# Patient Record
Sex: Male | Born: 1990
Health system: Southern US, Community
[De-identification: ages and names within clinical notes are randomized; demographics above are authoritative.]

## PROBLEM LIST (undated history)

## (undated) DIAGNOSIS — S91339A Puncture wound without foreign body, unspecified foot, initial encounter: Secondary | ICD-10-CM

## (undated) DIAGNOSIS — W3400XA Accidental discharge from unspecified firearms or gun, initial encounter: Secondary | ICD-10-CM

## (undated) HISTORY — PX: WISDOM TOOTH EXTRACTION: SHX21

## (undated) HISTORY — PX: NO PAST SURGERIES: SHX2092

---
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
## 2012-05-09 ENCOUNTER — Encounter (HOSPITAL_BASED_OUTPATIENT_CLINIC_OR_DEPARTMENT_OTHER): Payer: Self-pay | Admitting: *Deleted

## 2012-05-09 ENCOUNTER — Emergency Department (HOSPITAL_BASED_OUTPATIENT_CLINIC_OR_DEPARTMENT_OTHER)
Admission: EM | Admit: 2012-05-09 | Discharge: 2012-05-09 | Disposition: A | Discharge status: Home / Self Care | Payer: Managed Care, Other (non HMO) | Attending: Emergency Medicine | Admitting: Emergency Medicine

## 2012-05-09 DIAGNOSIS — Z23 Encounter for immunization: Secondary | ICD-10-CM | POA: Insufficient documentation

## 2012-05-09 DIAGNOSIS — F17200 Nicotine dependence, unspecified, uncomplicated: Secondary | ICD-10-CM | POA: Insufficient documentation

## 2012-05-09 DIAGNOSIS — IMO0002 Reserved for concepts with insufficient information to code with codable children: Secondary | ICD-10-CM

## 2012-05-09 DIAGNOSIS — S21209A Unspecified open wound of unspecified back wall of thorax without penetration into thoracic cavity, initial encounter: Secondary | ICD-10-CM | POA: Insufficient documentation

## 2012-05-09 MED ORDER — LIDOCAINE HCL 2 % IJ SOLN
INTRAMUSCULAR | Status: AC
  Administered 2012-05-09: 400 mg
  Filled 2012-05-09: qty 1

## 2012-05-09 MED ORDER — "THROMBI-PAD 3""X3"" EX PADS"
MEDICATED_PAD | CUTANEOUS | Status: AC
  Administered 2012-05-09: 1 via TOPICAL
  Filled 2012-05-09: qty 1

## 2012-05-09 MED ORDER — LIDOCAINE-EPINEPHRINE 2 %-1:100000 IJ SOLN
20.0000 mL | Freq: Once | INTRAMUSCULAR | Status: AC
  Administered 2012-05-09: 20 mL

## 2012-05-09 MED ORDER — TETANUS-DIPHTH-ACELL PERTUSSIS 5-2.5-18.5 LF-MCG/0.5 IM SUSP
0.5000 mL | Freq: Once | INTRAMUSCULAR | Status: AC
  Administered 2012-05-09: 0.5 mL via INTRAMUSCULAR
  Filled 2012-05-09: qty 0.5

## 2012-05-09 MED ORDER — "THROMBI-PAD 3""X3"" EX PADS"
1.0000 | MEDICATED_PAD | Freq: Once | CUTANEOUS | Status: AC
  Administered 2012-05-09: 1 via TOPICAL

## 2012-05-09 MED ORDER — LIDOCAINE HCL 2 % IJ SOLN
20.0000 mL | Freq: Once | INTRAMUSCULAR | Status: AC
  Administered 2012-05-09: 400 mg

## 2012-05-09 MED ORDER — LIDOCAINE-EPINEPHRINE 2 %-1:100000 IJ SOLN
INTRAMUSCULAR | Status: AC
  Administered 2012-05-09: 20 mL
  Filled 2012-05-09: qty 1

## 2012-05-09 NOTE — Discharge Instructions (Signed)
Laceration Care, Adult °A laceration is a cut that goes through all layers of the skin. The cut goes into the tissue beneath the skin. °HOME CARE °For stitches (sutures) or staples: °· Keep the cut clean and dry.  °· If you have a bandage (dressing), change it at least once a day. Change the bandage if it gets wet or dirty, or as told by your doctor.  °· Wash the cut with soap and water 2 times a day. Rinse the cut with water. Pat it dry with a clean towel.  °· Put a thin layer of medicated cream on the cut as told by your doctor.  °· You may shower after the first 24 hours. Do not soak the cut in water until the stitches are removed.  °· Only take medicines as told by your doctor.  °· Have your stitches or staples removed as told by your doctor.  °For skin adhesive strips: °· Keep the cut clean and dry.  °· Do not get the strips wet. You may take a bath, but be careful to keep the cut dry.  °· If the cut gets wet, pat it dry with a clean towel.  °· The strips will fall off on their own. Do not remove the strips that are still stuck to the cut.  °For wound glue: °· You may shower or take baths. Do not soak or scrub the cut. Do not swim. Avoid heavy sweating until the glue falls off on its own. After a shower or bath, pat the cut dry with a clean towel.  °· Do not put medicine on your cut until the glue falls off.  °· If you have a bandage, do not put tape over the glue.  °· Avoid lots of sunlight or tanning lamps until the glue falls off. Put sunscreen on the cut for the first year to reduce your scar.  °· The glue will fall off on its own. Do not pick at the glue.  °You may need a tetanus shot if: °· You cannot remember when you had your last tetanus shot.  °· You have never had a tetanus shot.  °If you need a tetanus shot and you choose not to have one, you may get tetanus. Sickness from tetanus can be serious. °GET HELP RIGHT AWAY IF:  °· Your pain does not get better with medicine.  °· Your arm, hand, leg, or  foot loses feeling (numbness) or changes color.  °· Your cut is bleeding.  °· Your joint feels weak, or you cannot use your joint.  °· You have painful lumps on your body.  °· Your cut is red, puffy (swollen), or painful.  °· You have a red line on the skin near the cut.  °· You have yellowish-white fluid (pus) coming from the cut.  °· You have a fever.  °· You have a bad smell coming from the cut or bandage.  °· Your cut breaks open before or after stitches are removed.  °· You notice something coming out of the cut, such as wood or glass.  °· You cannot move a finger or toe.  °MAKE SURE YOU:  °· Understand these instructions.  °· Will watch your condition.  °· Will get help right away if you are not doing well or get worse.  °Document Released: 05/06/2008 Document Revised: 11/07/2011 Document Reviewed: 05/14/2011 °Exit[employer]® Patient Information ©2012 [employer]. °

## 2012-05-09 NOTE — ED Notes (Signed)
No bleeding since clot pad was removed over last ten minutes.
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
## 2012-05-09 NOTE — ED Notes (Signed)
Wound care completed.
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
## 2012-05-21 DIAGNOSIS — Z4802 Encounter for removal of sutures: Secondary | ICD-10-CM | POA: Insufficient documentation

## 2012-05-21 DIAGNOSIS — F17200 Nicotine dependence, unspecified, uncomplicated: Secondary | ICD-10-CM | POA: Insufficient documentation

## 2012-05-22 ENCOUNTER — Emergency Department (HOSPITAL_BASED_OUTPATIENT_CLINIC_OR_DEPARTMENT_OTHER)
Admission: EM | Admit: 2012-05-22 | Discharge: 2012-05-22 | Disposition: A | Discharge status: Home / Self Care | Payer: Managed Care, Other (non HMO) | Attending: Emergency Medicine | Admitting: Emergency Medicine

## 2012-05-22 ENCOUNTER — Encounter (HOSPITAL_BASED_OUTPATIENT_CLINIC_OR_DEPARTMENT_OTHER): Payer: Self-pay | Admitting: Emergency Medicine

## 2012-05-22 DIAGNOSIS — Z4802 Encounter for removal of sutures: Secondary | ICD-10-CM

## 2012-05-22 NOTE — ED Notes (Signed)
Sutures removed by Dr. Lynelle Doctor

## 2012-05-22 NOTE — ED Provider Notes (Signed)
History     CSN: [id number]  Arrival date & time 05/21/12  2356   First MD Initiated Contact with Patient 05/22/12 0009      Chief Complaint  Patient presents with  . Suture / Staple Removal    Patient is a 21 y.o. male presenting with suture removal. The history is provided by the patient.  Suture / Staple Removal  The sutures were placed 11 to 14 days ago. There has been no treatment since the wound repair. There has been no drainage from the wound. There is no redness present. There is no swelling present. The pain has no pain. He has no difficulty moving the affected extremity or digit.    History reviewed. No pertinent past medical history.  History reviewed. No pertinent past surgical history.  No family history on file.  History  Substance Use Topics  . Smoking status: Current Everyday Smoker  . Smokeless tobacco: Not on file  . Alcohol Use: Yes      Review of Systems  Constitutional: Negative for fever.    Allergies  Review of patient's allergies indicates no known allergies.  Home Medications  No current outpatient prescriptions on file.  BP 132/73  Pulse 96  Temp 97.5 F (36.4 C) (Oral)  Resp 18  SpO2 99%  Physical Exam  Nursing note and vitals reviewed. Constitutional: He appears well-developed and well-nourished. No distress.  HENT:  Head: Normocephalic and atraumatic.  Right Ear: External ear normal.  Left Ear: External ear normal.  Eyes: Conjunctivae are normal. Right eye exhibits no discharge. Left eye exhibits no discharge. No scleral icterus.  Neck: Neck supple. No tracheal deviation present.  Cardiovascular: Normal rate.   Pulmonary/Chest: Effort normal. No stridor. No respiratory distress.  Musculoskeletal: He exhibits no edema.       Stapled wound left posterior scapula region, no e/e, wound well approximated  Neurological: He is alert. Cranial nerve deficit: no gross deficits.  Skin: Skin is warm and dry. No rash noted.    Psychiatric: He has a normal mood and affect.    ED Course  Procedures (including critical care time) Staples removed.  Pt tolerated procedure well.  No dehiscence.  No sign of infection. Labs Reviewed - No data to display No results found.   1. Removal of staples       MDM  No sign of infection.  Routine wound treatment.  No further care needed.         Celene Kras, MD 05/22/12 [phone]

## 2012-05-22 NOTE — ED Notes (Signed)
Bacitracin applied, no active bleeding. No signs of infection.

## 2012-05-22 NOTE — Discharge Instructions (Signed)
Staple Removal  Care After  The staples used to close your skin have been removed. The wound needs continued care so it can heal completely and without problems. The care described here will need to be done for another 5-10 days unless your caregiver advises otherwise.   HOME CARE INSTRUCTIONS    Keep wound site dry and clean.   If skin adhesive strips were applied after the staples were removed, they will begin to peel off in a few days. If they remain after fourteen days, they may be peeled off and discarded.   If you still have a dressing, change it at least once a day or as instructed by your caregiver. If the bandage sticks, soak it off with warm water. Pat dry with a clean towel. Look for signs of infection (see below).   Reapply cream or ointment according to your caregiver's instruction. This will help prevent infection and keep the bandage from sticking. Use of a non-stick material over the wound and under the dressing or wrap will also help keep the bandage from sticking.   If the bandage becomes wet, dirty or develops a foul smell, change it as soon as possible.   New scars become sunburned easily. Use sunscreens with protection factor (SPF) of at least 15 when out in the sun.   Only take over-the-counter or prescription medicines for pain, discomfort or fever as directed by your caregiver.  SEEK IMMEDIATE MEDICAL CARE IF:    There is redness, swelling or increasing pain in the wound.   Pus is coming from the wound.   An unexplained oral temperature above 102 F (38.9 C) develops.   You notice a foul smell coming from the wound or dressing.   There is a breaking open of the suture line (edges not staying together) of the wound edges after staples have been removed.  Document Released: 10/31/2008 Document Revised: 11/07/2011 Document Reviewed: 10/31/2008  [employer] Patient Information 2012 [employer].

## 2012-05-22 NOTE — ED Notes (Signed)
Pt here for staple removal in left shoulder

## 2019-02-12 ENCOUNTER — Emergency Department (HOSPITAL_BASED_OUTPATIENT_CLINIC_OR_DEPARTMENT_OTHER): Payer: No Typology Code available for payment source

## 2019-02-12 ENCOUNTER — Encounter (HOSPITAL_BASED_OUTPATIENT_CLINIC_OR_DEPARTMENT_OTHER): Payer: Self-pay

## 2019-02-12 ENCOUNTER — Other Ambulatory Visit: Payer: Self-pay

## 2019-02-12 ENCOUNTER — Emergency Department (HOSPITAL_BASED_OUTPATIENT_CLINIC_OR_DEPARTMENT_OTHER)
Admission: EM | Admit: 2019-02-12 | Discharge: 2019-02-12 | Disposition: A | Discharge status: Home / Self Care | Payer: No Typology Code available for payment source | Attending: Emergency Medicine | Admitting: Emergency Medicine

## 2019-02-12 DIAGNOSIS — Z87891 Personal history of nicotine dependence: Secondary | ICD-10-CM | POA: Diagnosis not present

## 2019-02-12 DIAGNOSIS — M7918 Myalgia, other site: Secondary | ICD-10-CM | POA: Diagnosis not present

## 2019-02-12 MED ORDER — NAPROXEN 500 MG PO TABS: 500.0000 mg | ORAL_TABLET | Freq: Two times a day (BID) | ORAL | 0 refills | Status: DC
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
## 2019-02-17 ENCOUNTER — Emergency Department (HOSPITAL_BASED_OUTPATIENT_CLINIC_OR_DEPARTMENT_OTHER): Payer: No Typology Code available for payment source

## 2019-02-17 ENCOUNTER — Encounter (HOSPITAL_BASED_OUTPATIENT_CLINIC_OR_DEPARTMENT_OTHER): Payer: Self-pay | Admitting: Emergency Medicine

## 2019-02-17 ENCOUNTER — Other Ambulatory Visit: Payer: Self-pay

## 2019-02-17 ENCOUNTER — Emergency Department (HOSPITAL_BASED_OUTPATIENT_CLINIC_OR_DEPARTMENT_OTHER)
Admission: EM | Admit: 2019-02-17 | Discharge: 2019-02-17 | Disposition: A | Discharge status: Home / Self Care | Payer: No Typology Code available for payment source | Attending: Emergency Medicine | Admitting: Emergency Medicine

## 2019-02-17 DIAGNOSIS — Z87891 Personal history of nicotine dependence: Secondary | ICD-10-CM | POA: Diagnosis not present

## 2019-02-17 DIAGNOSIS — M545 Low back pain, unspecified: Secondary | ICD-10-CM

## 2019-02-17 MED ORDER — CYCLOBENZAPRINE HCL 10 MG PO TABS: 10.0000 mg | ORAL_TABLET | Freq: Two times a day (BID) | ORAL | 0 refills | Status: DC | PRN

## 2019-02-17 MED FILL — CYCLOBENZAPRINE HCL 10 MG T: 10 | 10 days supply | Qty: 20 | Fill #0
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
## 2019-10-23 ENCOUNTER — Emergency Department (HOSPITAL_COMMUNITY)
Admission: EM | Admit: 2019-10-23 | Discharge: 2019-10-24 | Disposition: A | Discharge status: Home / Self Care | Payer: No Typology Code available for payment source | Attending: Emergency Medicine | Admitting: Emergency Medicine

## 2019-10-23 ENCOUNTER — Other Ambulatory Visit: Payer: Self-pay

## 2019-10-23 ENCOUNTER — Encounter (HOSPITAL_COMMUNITY): Payer: Self-pay | Admitting: Emergency Medicine

## 2019-10-23 DIAGNOSIS — S92302A Fracture of unspecified metatarsal bone(s), left foot, initial encounter for closed fracture: Secondary | ICD-10-CM | POA: Diagnosis not present

## 2019-10-23 DIAGNOSIS — Z23 Encounter for immunization: Secondary | ICD-10-CM | POA: Diagnosis not present

## 2019-10-23 DIAGNOSIS — Y999 Unspecified external cause status: Secondary | ICD-10-CM | POA: Insufficient documentation

## 2019-10-23 DIAGNOSIS — R739 Hyperglycemia, unspecified: Secondary | ICD-10-CM | POA: Diagnosis not present

## 2019-10-23 DIAGNOSIS — Z87891 Personal history of nicotine dependence: Secondary | ICD-10-CM | POA: Diagnosis not present

## 2019-10-23 DIAGNOSIS — F10920 Alcohol use, unspecified with intoxication, uncomplicated: Secondary | ICD-10-CM | POA: Insufficient documentation

## 2019-10-23 DIAGNOSIS — Y9289 Other specified places as the place of occurrence of the external cause: Secondary | ICD-10-CM | POA: Diagnosis not present

## 2019-10-23 DIAGNOSIS — Y939 Activity, unspecified: Secondary | ICD-10-CM | POA: Insufficient documentation

## 2019-10-23 DIAGNOSIS — S81832A Puncture wound without foreign body, left lower leg, initial encounter: Secondary | ICD-10-CM | POA: Insufficient documentation

## 2019-10-23 DIAGNOSIS — W3400XA Accidental discharge from unspecified firearms or gun, initial encounter: Secondary | ICD-10-CM

## 2019-10-23 MED ORDER — ONDANSETRON HCL 4 MG/2ML IJ SOLN
4.0000 mg | Freq: Once | INTRAMUSCULAR | Status: AC
  Administered 2019-10-24: 4 mg via INTRAVENOUS
  Filled 2019-10-23: qty 2

## 2019-10-23 MED ORDER — FENTANYL CITRATE (PF) 100 MCG/2ML IJ SOLN
50.0000 ug | Freq: Once | INTRAMUSCULAR | Status: AC
  Administered 2019-10-24: 50 ug via INTRAVENOUS
  Filled 2019-10-23: qty 2

## 2019-10-23 MED ORDER — TETANUS-DIPHTH-ACELL PERTUSSIS 5-2.5-18.5 LF-MCG/0.5 IM SUSP
0.5000 mL | Freq: Once | INTRAMUSCULAR | Status: AC
Start: 2019-10-24 — End: 2019-10-24
  Administered 2019-10-24: 0.5 mL via INTRAMUSCULAR
  Filled 2019-10-23: qty 0.5

## 2019-10-23 MED ORDER — SODIUM CHLORIDE 0.9 % IV SOLN
INTRAVENOUS | Status: DC
  Administered 2019-10-24: 01:00:00 via INTRAVENOUS
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
## 2019-10-24 ENCOUNTER — Emergency Department (HOSPITAL_COMMUNITY): Payer: No Typology Code available for payment source

## 2019-10-24 LAB — CBC
HCT: 42.9 % (ref 39.0–52.0)
Hemoglobin: 13.8 g/dL (ref 13.0–17.0)
MCH: 27.5 pg (ref 26.0–34.0)
MCHC: 32.2 g/dL (ref 30.0–36.0)
MCV: 85.6 fL (ref 80.0–100.0)
Platelets: 374 10*3/uL (ref 150–400)
RBC: 5.01 MIL/uL (ref 4.22–5.81)
RDW: 13 % (ref 11.5–15.5)
WBC: 8.3 10*3/uL (ref 4.0–10.5)
nRBC: 0 % (ref 0.0–0.2)

## 2019-10-24 LAB — COMPREHENSIVE METABOLIC PANEL
ALT: 27 U/L (ref 0–44)
AST: 22 U/L (ref 15–41)
Albumin: 4.1 g/dL (ref 3.5–5.0)
Alkaline Phosphatase: 65 U/L (ref 38–126)
Anion gap: 10 (ref 5–15)
BUN: 13 mg/dL (ref 6–20)
CO2: 21 mmol/L — ABNORMAL LOW (ref 22–32)
Calcium: 9 mg/dL (ref 8.9–10.3)
Chloride: 103 mmol/L (ref 98–111)
Creatinine, Ser: 1.18 mg/dL (ref 0.61–1.24)
GFR calc Af Amer: >60 mL/min (ref >60)
GFR calc non Af Amer: >60 mL/min (ref >60)
Glucose, Bld: 296 mg/dL — ABNORMAL HIGH (ref 70–99)
Potassium: 3.7 mmol/L (ref 3.5–5.1)
Sodium: 134 mmol/L — ABNORMAL LOW (ref 135–145)
Total Bilirubin: 0.4 mg/dL (ref 0.3–1.2)
Total Protein: 6.8 g/dL (ref 6.5–8.1)

## 2019-10-24 LAB — TYPE AND SCREEN
ABO/RH(D): AB POS
Antibody Screen: NEGATIVE

## 2019-10-24 LAB — ABO/RH: ABO/RH(D): AB POS

## 2019-10-24 LAB — PROTIME-INR
INR: 0.9 (ref 0.8–1.2)
Prothrombin Time: 12.2 seconds (ref 11.4–15.2)

## 2019-10-24 LAB — ETHANOL: Alcohol, Ethyl (B): 250 mg/dL — ABNORMAL HIGH (ref <10)

## 2019-10-24 MED ORDER — HYDROMORPHONE HCL 1 MG/ML IJ SOLN
1.0000 mg | Freq: Once | INTRAMUSCULAR | Status: AC
  Administered 2019-10-24: 02:00:00 1 mg via INTRAVENOUS
  Filled 2019-10-24: qty 1

## 2019-10-24 MED ORDER — OXYCODONE-ACETAMINOPHEN 5-325 MG PO TABS: 2.0000 | ORAL_TABLET | Freq: Four times a day (QID) | ORAL | 0 refills | Status: DC | PRN

## 2019-10-24 MED ORDER — CEFAZOLIN SODIUM-DEXTROSE 2-4 GM/100ML-% IV SOLN
2.0000 g | Freq: Once | INTRAVENOUS | Status: AC
  Administered 2019-10-24: 02:00:00 2 g via INTRAVENOUS
  Filled 2019-10-24: qty 100

## 2019-10-24 MED ORDER — CEFAZOLIN SODIUM-DEXTROSE 1-4 GM/50ML-% IV SOLN: 1.0000 g | Freq: Once | INTRAVENOUS | Status: DC

## 2019-10-24 MED ORDER — DOCUSATE SODIUM 100 MG PO CAPS: 100.0000 mg | ORAL_CAPSULE | Freq: Two times a day (BID) | ORAL | 0 refills | Status: AC

## 2019-10-24 MED ORDER — ONDANSETRON 4 MG PO TBDP: 4.0000 mg | ORAL_TABLET | Freq: Four times a day (QID) | ORAL | 0 refills | Status: DC | PRN
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
## 2019-10-24 NOTE — Discharge Instructions (Signed)
You may take 1 to 2 tablets of Percocet every 6 hours as needed for pain.  You should not bear any weight at this time.  Please follow-up with Dr. Stann Mainland with orthopedic surgery for further evaluation and treatment.  Please keep your splint on at all times and keep it clean and dry.  I recommend keeping your leg elevated and resting as much as possible to help with pain and swelling.  You have been given a dose of antibiotics here in the emergency department and do not need to be discharged with a prescription of antibiotics.  Your tetanus vaccination has also been updated.  Your blood sugar was also elevated today to 296.  I do not feel you need to be started on medication for diabetes but should follow-up with her primary care physician to have this rechecked and to have your hemoglobin A1c checked.  You are being provided a prescription for opiates (also known as narcotics) for pain control.  Opiates can be addictive and should only be used when absolutely necessary for pain control when other alternatives do not work.  We recommend you only use them for the recommended amount of time and only as prescribed.  Please do not take with other sedative medications or alcohol.  Please do not drive, operate machinery, make important decisions while taking opiates.  Please note that these medications can be addictive and have high abuse potential.  Patients can become addicted to narcotics after only taking them for a few days.  Please keep these medications locked away from children, teenagers or any family members with history of substance abuse.  Narcotic pain medicine may also make you constipated.  You may use over-the-counter medications such as MiraLAX, Colace to prevent constipation.  If you become constipated you may use over-the-counter enemas as needed.  Itching and nausea are common side effects of narcotic pain medication.  If you develop uncontrolled vomiting or a rash, please stop these  medications.   Steps to find a Primary Care Provider (PCP):  Call [phone] or [phone] to access "[facility] a Doctor Service."  2.  You may also go on the [facility] website at [email or web address]  3.  [facility] also frequently accepts new patients.  [place] [phone]  4.  There are also multiple [facility], [facility] and [facility] practices throughout the [place] that are frequently accepting new patients. You may find a clinic that is close to your home and contact them.  [facility] [email or web address] [phone]  [facility] [email or web address]  [email or web address] [place] [email or web address] [phone]  5.  Local Health Departments also can provide primary care services.  [facility]  [place] [address] [phone]  [facility] [place] [address] [place] [facility] [place] [facility] [phone]

## 2019-10-24 NOTE — Progress Notes (Signed)
Orthopedic Tech Progress Note Patient Details:  Andre Wheeler Jul 02, 1991 [id number]  Ortho Devices Type of Ortho Device: Crutches, Post (short leg) splint Ortho Device/Splint Location: lle Ortho Device/Splint Interventions: Ordered, Application, Adjustment   Post Interventions Patient Tolerated: Well Instructions Provided: Care of device, Adjustment of device   Karolee Stamps 10/24/2019, 5:26 AM

## 2019-10-24 NOTE — ED Notes (Signed)
Discharge instructions reviewed with pt. Pt verbalized understanding.   

## 2019-11-08 ENCOUNTER — Other Ambulatory Visit: Payer: Self-pay

## 2019-11-08 ENCOUNTER — Encounter (HOSPITAL_BASED_OUTPATIENT_CLINIC_OR_DEPARTMENT_OTHER): Payer: Self-pay | Admitting: *Deleted
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
## 2019-11-08 NOTE — Progress Notes (Signed)
[facility] [address] [place], [place] - [address] [place] [address] Phone: [phone] Fax: [phone]     Your procedure is scheduled on Thursday, December 10th, 2020.   Report to [facility] Main Entrance "A" at 8:30 A.M., and check in at the Admitting office.   Call this number if you have problems the morning of surgery:  [phone]  Call [phone] if you have any questions prior to your surgery date Monday-Friday 8am-4pm    Remember:  Do not eat after midnight the night before your surgery  You may drink clear liquids until 7:30AM the morning of your surgery.    Clear liquids allowed are: Water, Non-Citrus Juices (without pulp), Carbonated Beverages, Clear Tea, Black Coffee Only, and Gatorade   Please complete your PRE-SURGERY ENSURE that was provided to you by 7:30AM the morning of surgery.  Please, if able, drink it in one setting. DO NOT SIP.   Take these medicines the morning of surgery with A SIP OF WATER :  Oxycodone-Acetaminophen (Percocet/Roxicet) - if needed  7 days prior to surgery STOP taking any Aspirin (unless otherwise instructed by your surgeon), Aleve, Naproxen, Ibuprofen, Motrin, Advil, Goody's, BC's, all herbal medications, fish oil, and all vitamins.    The Morning of Surgery  Do not wear jewelry.  Do not wear lotions, powders, colognes, or deodorant  Do not shave 48 hours prior to surgery.  Men may shave face and neck.  Do not bring valuables to the hospital.  [facility] is not responsible for any belongings or valuables.  If you are a smoker, DO NOT Smoke 24 hours prior to surgery  If you wear a CPAP at night please bring your mask, tubing, and machine the morning of surgery   Remember that you must have someone to transport you home after your surgery, and remain with you for 24 hours if you are discharged the same day.   Please bring cases for contacts, glasses, hearing aids, dentures or  bridgework because it cannot be worn into surgery.    Leave your suitcase in the car.  After surgery it may be brought to your room.  For patients admitted to the hospital, discharge time will be determined by your treatment team.  Patients discharged the day of surgery will not be allowed to drive home.    Special instructions:   [place]- Preparing For Surgery  Before surgery, you can play an important role. Because skin is not sterile, your skin needs to be as free of germs as possible. You can reduce the number of germs on your skin by washing with CHG (chlorahexidine gluconate) Soap before surgery.  CHG is an antiseptic cleaner which kills germs and bonds with the skin to continue killing germs even after washing.    Oral Hygiene is also important to reduce your risk of infection.  Remember - BRUSH YOUR TEETH THE MORNING OF SURGERY WITH YOUR REGULAR TOOTHPASTE  Please do not use if you have an allergy to CHG or antibacterial soaps. If your skin becomes reddened/irritated stop using the CHG.  Do not shave (including legs and underarms) for at least 48 hours prior to first CHG shower. It is OK to shave your face.  Please follow these instructions carefully.   1. Shower the NIGHT BEFORE SURGERY and the MORNING OF SURGERY with CHG Soap.   2. If you chose to wash your hair, wash your hair first as usual with your normal  shampoo.  3. After you shampoo, rinse your hair and body thoroughly to remove the shampoo.  4. Use CHG as you would any other liquid soap. You can apply CHG directly to the skin and wash gently with a scrungie or a clean washcloth.   5. Apply the CHG Soap to your body ONLY FROM THE NECK DOWN.  Do not use on open wounds or open sores. Avoid contact with your eyes, ears, mouth and genitals (private parts). Wash Face and genitals (private parts)  with your normal soap.   6. Wash thoroughly, paying special attention to the area where your surgery will be  performed.  7. Thoroughly rinse your body with warm water from the neck down.  8. DO NOT shower/wash with your normal soap after using and rinsing off the CHG Soap.  9. Pat yourself dry with a CLEAN TOWEL.  10. Wear CLEAN PAJAMAS to bed the night before surgery, wear comfortable clothes the morning of surgery  11. Place CLEAN SHEETS on your bed the night of your first shower and DO NOT SLEEP WITH PETS.    Day of Surgery:  Please shower the morning of surgery with the CHG soap Do not apply any deodorants/lotions. Please wear clean clothes to the hospital/surgery center.   Remember to brush your teeth WITH YOUR REGULAR TOOTHPASTE.   Please read over the following fact sheets that you were given.

## 2019-11-09 ENCOUNTER — Other Ambulatory Visit (HOSPITAL_COMMUNITY)
Admission: RE | Admit: 2019-11-09 | Discharge: 2019-11-09 | Disposition: A | Discharge status: Home / Self Care | Payer: Managed Care, Other (non HMO) | Source: Ambulatory Visit | Attending: Orthopedic Surgery | Admitting: Orthopedic Surgery

## 2019-11-09 ENCOUNTER — Inpatient Hospital Stay (HOSPITAL_COMMUNITY)
Admission: RE | Admit: 2019-11-09 | Discharge: 2019-11-09 | Disposition: A | Discharge status: Home / Self Care | Payer: Managed Care, Other (non HMO) | Source: Ambulatory Visit

## 2019-11-09 DIAGNOSIS — Z20828 Contact with and (suspected) exposure to other viral communicable diseases: Secondary | ICD-10-CM | POA: Insufficient documentation

## 2019-11-09 DIAGNOSIS — Z01812 Encounter for preprocedural laboratory examination: Secondary | ICD-10-CM | POA: Insufficient documentation

## 2019-11-10 LAB — SARS CORONAVIRUS 2 (TAT 6-24 HRS): SARS Coronavirus 2: NEGATIVE

## 2019-11-11 ENCOUNTER — Ambulatory Visit (HOSPITAL_BASED_OUTPATIENT_CLINIC_OR_DEPARTMENT_OTHER): Payer: No Typology Code available for payment source | Admitting: Anesthesiology

## 2019-11-11 ENCOUNTER — Ambulatory Visit (HOSPITAL_BASED_OUTPATIENT_CLINIC_OR_DEPARTMENT_OTHER)
Admission: RE | Admit: 2019-11-11 | Discharge: 2019-11-11 | Disposition: A | Discharge status: Home / Self Care | Payer: No Typology Code available for payment source | Attending: Orthopedic Surgery | Admitting: Orthopedic Surgery

## 2019-11-11 ENCOUNTER — Encounter (HOSPITAL_BASED_OUTPATIENT_CLINIC_OR_DEPARTMENT_OTHER)
Admission: RE | Disposition: A | Discharge status: Home / Self Care | Payer: Self-pay | Source: Home / Self Care | Attending: Orthopedic Surgery

## 2019-11-11 ENCOUNTER — Encounter (HOSPITAL_BASED_OUTPATIENT_CLINIC_OR_DEPARTMENT_OTHER): Payer: Self-pay | Admitting: Orthopedic Surgery

## 2019-11-11 DIAGNOSIS — S92322B Displaced fracture of second metatarsal bone, left foot, initial encounter for open fracture: Secondary | ICD-10-CM | POA: Insufficient documentation

## 2019-11-11 DIAGNOSIS — S92332B Displaced fracture of third metatarsal bone, left foot, initial encounter for open fracture: Secondary | ICD-10-CM | POA: Insufficient documentation

## 2019-11-11 DIAGNOSIS — S92312B Displaced fracture of first metatarsal bone, left foot, initial encounter for open fracture: Secondary | ICD-10-CM

## 2019-11-11 DIAGNOSIS — S90852A Superficial foreign body, left foot, initial encounter: Secondary | ICD-10-CM | POA: Diagnosis present

## 2019-11-11 DIAGNOSIS — S91342A Puncture wound with foreign body, left foot, initial encounter: Secondary | ICD-10-CM | POA: Diagnosis not present

## 2019-11-11 HISTORY — DX: Displaced fracture of first metatarsal bone, left foot, initial encounter for open fracture: S92.312B

## 2019-11-11 HISTORY — DX: Puncture wound without foreign body, unspecified foot, initial encounter: S91.339A

## 2019-11-11 HISTORY — PX: ORIF TOE FRACTURE: SHX5032

## 2019-11-11 LAB — GLUCOSE, CAPILLARY
Glucose-Capillary: 298 mg/dL — ABNORMAL HIGH (ref 70–99)
Glucose-Capillary: 268 mg/dL — ABNORMAL HIGH (ref 70–99)
Glucose-Capillary: 273 mg/dL — ABNORMAL HIGH (ref 70–99)

## 2019-11-11 SURGERY — OPEN REDUCTION INTERNAL FIXATION (ORIF) METATARSAL (TOE) FRACTURE
Anesthesia: General | Site: Foot | Laterality: Left

## 2019-11-11 MED ORDER — PROMETHAZINE HCL 25 MG/ML IJ SOLN
6.2500 mg | INTRAMUSCULAR | Status: DC | PRN
  Filled 2019-11-11: qty 1

## 2019-11-11 MED ORDER — LIDOCAINE 2% (20 MG/ML) 5 ML SYRINGE
INTRAMUSCULAR | Status: DC | PRN
  Administered 2019-11-11: 60 mg via INTRAVENOUS

## 2019-11-11 MED ORDER — OXYCODONE HCL 5 MG PO TABS: 5.0000 mg | ORAL_TABLET | ORAL | 0 refills | Status: AC | PRN

## 2019-11-11 MED ORDER — CEFAZOLIN SODIUM-DEXTROSE 2-4 GM/100ML-% IV SOLN
2.0000 g | INTRAVENOUS | Status: AC
  Administered 2019-11-11: 2 g via INTRAVENOUS
  Filled 2019-11-11: qty 100

## 2019-11-11 MED ORDER — INSULIN ASPART 100 UNIT/ML ~~LOC~~ SOLN
SUBCUTANEOUS | Status: DC | PRN
  Administered 2019-11-11: 5 [IU] via SUBCUTANEOUS

## 2019-11-11 MED ORDER — LIDOCAINE 2% (20 MG/ML) 5 ML SYRINGE
INTRAMUSCULAR | Status: AC
  Filled 2019-11-11: qty 5

## 2019-11-11 MED ORDER — INSULIN ASPART 100 UNIT/ML ~~LOC~~ SOLN
SUBCUTANEOUS | Status: AC
  Filled 2019-11-11: qty 1

## 2019-11-11 MED ORDER — PROPOFOL 10 MG/ML IV BOLUS
INTRAVENOUS | Status: DC | PRN
  Administered 2019-11-11: 280 mg via INTRAVENOUS

## 2019-11-11 MED ORDER — ONDANSETRON 4 MG PO TBDP: 4.0000 mg | ORAL_TABLET | Freq: Three times a day (TID) | ORAL | 0 refills | Status: AC | PRN

## 2019-11-11 MED ORDER — DEXAMETHASONE SODIUM PHOSPHATE 10 MG/ML IJ SOLN
INTRAMUSCULAR | Status: AC
  Filled 2019-11-11: qty 1

## 2019-11-11 MED ORDER — FENTANYL CITRATE (PF) 100 MCG/2ML IJ SOLN
INTRAMUSCULAR | Status: DC | PRN
  Administered 2019-11-11 (×2): 50 ug via INTRAVENOUS

## 2019-11-11 MED ORDER — MIDAZOLAM HCL 2 MG/2ML IJ SOLN
2.0000 mg | Freq: Once | INTRAMUSCULAR | Status: AC
  Administered 2019-11-11: 2 mg via INTRAVENOUS
  Filled 2019-11-11: qty 2

## 2019-11-11 MED ORDER — FENTANYL CITRATE (PF) 100 MCG/2ML IJ SOLN
INTRAMUSCULAR | Status: AC
  Filled 2019-11-11: qty 2

## 2019-11-11 MED ORDER — INSULIN ASPART 100 UNIT/ML ~~LOC~~ SOLN
5.0000 [IU] | Freq: Once | SUBCUTANEOUS | Status: AC
  Administered 2019-11-11: 5 [IU] via SUBCUTANEOUS
  Filled 2019-11-11: qty 0.05

## 2019-11-11 MED ORDER — ONDANSETRON HCL 4 MG/2ML IJ SOLN
INTRAMUSCULAR | Status: DC | PRN
  Administered 2019-11-11: 4 mg via INTRAVENOUS

## 2019-11-11 MED ORDER — CEFAZOLIN SODIUM-DEXTROSE 2-4 GM/100ML-% IV SOLN
INTRAVENOUS | Status: AC
  Filled 2019-11-11: qty 100

## 2019-11-11 MED ORDER — FENTANYL CITRATE (PF) 100 MCG/2ML IJ SOLN
25.0000 ug | INTRAMUSCULAR | Status: DC | PRN
  Filled 2019-11-11: qty 1

## 2019-11-11 MED ORDER — OXYCODONE HCL 5 MG/5ML PO SOLN
5.0000 mg | Freq: Once | ORAL | Status: DC | PRN
  Filled 2019-11-11: qty 5

## 2019-11-11 MED ORDER — ONDANSETRON HCL 4 MG/2ML IJ SOLN
INTRAMUSCULAR | Status: AC
  Filled 2019-11-11: qty 2

## 2019-11-11 MED ORDER — CHLORHEXIDINE GLUCONATE 4 % EX LIQD
60.0000 mL | Freq: Once | CUTANEOUS | Status: DC
  Filled 2019-11-11: qty 118

## 2019-11-11 MED ORDER — MIDAZOLAM HCL 5 MG/5ML IJ SOLN
INTRAMUSCULAR | Status: DC | PRN
  Administered 2019-11-11: 2 mg via INTRAVENOUS

## 2019-11-11 MED ORDER — BUPIVACAINE-EPINEPHRINE (PF) 0.5% -1:200000 IJ SOLN
INTRAMUSCULAR | Status: DC | PRN
  Administered 2019-11-11: 15 mL via PERINEURAL
  Administered 2019-11-11: 25 mL via PERINEURAL

## 2019-11-11 MED ORDER — MIDAZOLAM HCL 2 MG/2ML IJ SOLN
INTRAMUSCULAR | Status: AC
  Filled 2019-11-11: qty 2

## 2019-11-11 MED ORDER — FENTANYL CITRATE (PF) 100 MCG/2ML IJ SOLN
100.0000 ug | Freq: Once | INTRAMUSCULAR | Status: AC
  Administered 2019-11-11: 100 ug via INTRAVENOUS
  Filled 2019-11-11: qty 2

## 2019-11-11 MED ORDER — OXYCODONE HCL 5 MG PO TABS
5.0000 mg | ORAL_TABLET | Freq: Once | ORAL | Status: DC | PRN
  Filled 2019-11-11: qty 1

## 2019-11-11 MED ORDER — PROPOFOL 10 MG/ML IV BOLUS
INTRAVENOUS | Status: AC
  Filled 2019-11-11: qty 40

## 2019-11-11 MED ORDER — LACTATED RINGERS IV SOLN
INTRAVENOUS | Status: DC
  Administered 2019-11-11: 10:00:00 via INTRAVENOUS
  Filled 2019-11-11: qty 1000

## 2019-11-11 SURGICAL SUPPLY — 67 items
ALCOHOL 70% 16 OZ (MISCELLANEOUS) ×3 IMPLANT
BANDAGE ACE 4X5 VEL STRL LF (GAUZE/BANDAGES/DRESSINGS) IMPLANT
BANDAGE ACE 6X5 VEL STRL LF (GAUZE/BANDAGES/DRESSINGS) ×3 IMPLANT
BANDAGE ESMARK 6X9 LF (GAUZE/BANDAGES/DRESSINGS) IMPLANT
BIT DRILL 1.8 STRL (DRILL) ×3 IMPLANT
BIT DRILL CALIBRATED 1.8MM (BIT) ×1 IMPLANT
BNDG COHESIVE 4X5 TAN STRL (GAUZE/BANDAGES/DRESSINGS) IMPLANT
BNDG ELASTIC 6X5.8 VLCR STR LF (GAUZE/BANDAGES/DRESSINGS) ×3 IMPLANT
BNDG ESMARK 6X9 LF (GAUZE/BANDAGES/DRESSINGS)
BNDG GAUZE ELAST 4 BULKY (GAUZE/BANDAGES/DRESSINGS) ×3 IMPLANT
CANISTER SUCT 3000ML PPV (MISCELLANEOUS) ×3 IMPLANT
CLOSURE WOUND 1/2 X4 (GAUZE/BANDAGES/DRESSINGS)
COVER SURGICAL LIGHT HANDLE (MISCELLANEOUS) ×3 IMPLANT
COVER WAND RF STERILE (DRAPES) ×3 IMPLANT
CUFF TOURN SGL QUICK 34 (TOURNIQUET CUFF) ×2
CUFF TRNQT CYL 34X4.125X (TOURNIQUET CUFF) ×1 IMPLANT
DRAPE C-ARM 42X120 X-RAY (DRAPES) ×3 IMPLANT
DRAPE INCISE IOBAN 66X45 STRL (DRAPES) IMPLANT
DRAPE SHEET LG 3/4 BI-LAMINATE (DRAPES) ×3 IMPLANT
DRAPE U-SHAPE 47X51 STRL (DRAPES) ×3 IMPLANT
DRILL CALIBRATED 1.8MM (BIT) ×3
DRSG ADAPTIC 3X8 NADH LF (GAUZE/BANDAGES/DRESSINGS) ×3 IMPLANT
DRSG PAD ABDOMINAL 8X10 ST (GAUZE/BANDAGES/DRESSINGS) ×3 IMPLANT
DURAPREP 26ML APPLICATOR (WOUND CARE) ×3 IMPLANT
ELECT REM PT RETURN 9FT ADLT (ELECTROSURGICAL) ×3
ELECTRODE REM PT RTRN 9FT ADLT (ELECTROSURGICAL) ×1 IMPLANT
GAUZE SPONGE 4X4 12PLY STRL (GAUZE/BANDAGES/DRESSINGS) ×6 IMPLANT
GAUZE SPONGE 4X4 16PLY XRAY LF (GAUZE/BANDAGES/DRESSINGS) ×3 IMPLANT
GAUZE XEROFORM 5X9 LF (GAUZE/BANDAGES/DRESSINGS) ×3 IMPLANT
GLOVE BIO SURGEON STRL SZ7.5 (GLOVE) ×3 IMPLANT
GLOVE BIOGEL PI IND STRL 8 (GLOVE) ×1 IMPLANT
GLOVE BIOGEL PI INDICATOR 8 (GLOVE) ×2
GOWN STRL REUS W/ TWL XL LVL3 (GOWN DISPOSABLE) ×1 IMPLANT
GOWN STRL REUS W/TWL XL LVL3 (GOWN DISPOSABLE) ×2
KIT TURNOVER CYSTO (KITS) ×3 IMPLANT
MANIFOLD NEPTUNE II (INSTRUMENTS) IMPLANT
NEEDLE HYPO 25X1 1.5 SAFETY (NEEDLE) IMPLANT
NS IRRIG 1000ML POUR BTL (IV SOLUTION) ×3 IMPLANT
PACK ORTHO EXTREMITY (CUSTOM PROCEDURE TRAY) ×3 IMPLANT
PAD ABD 8X10 STRL (GAUZE/BANDAGES/DRESSINGS) ×3 IMPLANT
PAD ARMBOARD 7.5X6 YLW CONV (MISCELLANEOUS) ×6 IMPLANT
PAD CAST 4YDX4 CTTN HI CHSV (CAST SUPPLIES) ×1 IMPLANT
PADDING CAST COTTON 4X4 STRL (CAST SUPPLIES) ×2
PADDING CAST COTTON 6X4 STRL (CAST SUPPLIES) IMPLANT
PLATE LCP 6H 52MM 2.4MM (Plate) ×3 IMPLANT
SCREW CORTEX 2.4X18 (Screw) ×3 IMPLANT
SCREW CORTEX 2.4X22MM (Screw) ×9 IMPLANT
SET PAD KNEE POSITIONER (MISCELLANEOUS) ×3 IMPLANT
SHOE DARCO MENS MED (MISCELLANEOUS) ×3 IMPLANT
SPONGE LAP 18X18 X RAY DECT (DISPOSABLE) IMPLANT
STOCKINETTE 6  STRL (DRAPES) ×2
STOCKINETTE 6 STRL (DRAPES) ×1 IMPLANT
STRIP CLOSURE SKIN 1/2X4 (GAUZE/BANDAGES/DRESSINGS) IMPLANT
SUCTION FRAZIER HANDLE 10FR (MISCELLANEOUS) ×2
SUCTION TUBE FRAZIER 10FR DISP (MISCELLANEOUS) ×1 IMPLANT
SUT ETHILON 3 0 PS 1 (SUTURE) ×6 IMPLANT
SUT VIC AB 0 CT1 27 (SUTURE)
SUT VIC AB 0 CT1 27XBRD ANBCTR (SUTURE) IMPLANT
SUT VIC AB 0 CT1 36 (SUTURE) ×3 IMPLANT
SUT VIC AB 2-0 CT1 27 (SUTURE)
SUT VIC AB 2-0 CT1 TAPERPNT 27 (SUTURE) IMPLANT
SUT VIC AB 2-0 CT2 27 (SUTURE) IMPLANT
SYR CONTROL 10ML LL (SYRINGE) IMPLANT
TOWEL OR 17X26 10 PK STRL BLUE (TOWEL DISPOSABLE) ×3 IMPLANT
TUBE CONNECTING 12'X1/4 (SUCTIONS) ×1
TUBE CONNECTING 12X1/4 (SUCTIONS) ×2 IMPLANT
YANKAUER SUCT BULB TIP NO VENT (SUCTIONS) ×3 IMPLANT
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
## 2019-11-11 NOTE — Progress Notes (Signed)
A round metal foreign body was removed from the left foot. The foreign body was placed in a plastic specimen cup and sealed with patient labels. This cup with the foreign body sealed inside was given to a Animal nutritionist named Dellia Cloud on 11/11/2019 at 1215 by Andrena Mews RN.

## 2019-11-11 NOTE — Anesthesia Postprocedure Evaluation (Signed)
Anesthesia Post Note  Patient: Andre Wheeler  Procedure(s) Performed: OPEN REDUCTION INTERNAL FIXATION (ORIF)1st METATARSAL, possible 2nd and 3rd with foreign body removal (Left Foot)     Patient location during evaluation: PACU Anesthesia Type: General Level of consciousness: awake and alert Pain management: pain level controlled Vital Signs Assessment: post-procedure vital signs reviewed and stable Respiratory status: spontaneous breathing, nonlabored ventilation and respiratory function stable Cardiovascular status: blood pressure returned to baseline and stable Postop Assessment: no apparent nausea or vomiting Anesthetic complications: no Comments: Instructed to follow up with PCP for probable undiagnosed diabetes    Last Vitals:  Vitals:   11/11/19 1300 11/11/19 1315  BP: (!) 131/93 (!) 132/92  Pulse: 89 85  Resp: 20 18  Temp:    SpO2: 100% 99%    Last Pain:  Vitals:   11/11/19 1315  TempSrc:   PainSc: 0-No pain                 Audry Pili

## 2019-11-11 NOTE — Transfer of Care (Signed)
Immediate Anesthesia Transfer of Care Note  Patient: Mattew Chriswell II  Procedure(s) Performed: OPEN REDUCTION INTERNAL FIXATION (ORIF)1st METATARSAL, possible 2nd and 3rd with foreign body removal (Left Foot)  Patient Location: PACU  Anesthesia Type:General  Level of Consciousness: drowsy  Airway & Oxygen Therapy: Patient Spontanous Breathing and Patient connected to nasal cannula oxygen  Post-op Assessment: Report given to RN  Post vital signs: Reviewed and stable  Last Vitals: 143/90, 97, 21, 98, 98.1 Vitals Value Taken Time  BP    Temp    Pulse    Resp    SpO2      Last Pain:  Vitals:   11/11/19 0931  TempSrc: Oral  PainSc: 0-No pain      Patients Stated Pain Goal: 6 (83/29/19 1660)  Complications: No apparent anesthesia complications

## 2019-11-11 NOTE — Progress Notes (Signed)
Assisted Dr. Brock with left, ultrasound guided, popliteal/saphenous block. Side rails up, monitors on throughout procedure. See vital signs in flow sheet. Tolerated Procedure well. 
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
## 2019-11-11 NOTE — Anesthesia Procedure Notes (Addendum)
Procedure Name: LMA Insertion Date/Time: 11/11/2019 10:51 AM Performed by: Bonney Aid, CRNA Pre-anesthesia Checklist: Patient identified, Emergency Drugs available, Suction available and Patient being monitored Patient Re-evaluated:Patient Re-evaluated prior to induction Oxygen Delivery Method: Circle system utilized Preoxygenation: Pre-oxygenation with 100% oxygen Induction Type: IV induction Ventilation: Mask ventilation without difficulty LMA: LMA inserted LMA Size: 5.0 Number of attempts: 1 Airway Equipment and Method: Bite block Placement Confirmation: positive ETCO2 Tube secured with: Tape Dental Injury: Teeth and Oropharynx as per pre-operative assessment

## 2019-11-11 NOTE — Anesthesia Preprocedure Evaluation (Addendum)
Anesthesia Evaluation  Patient identified by MRN, date of birth, ID band Patient awake    Reviewed: Allergy & Precautions, NPO status , Patient's Chart, lab work & pertinent test results  History of Anesthesia Complications Negative for: history of anesthetic complications  Airway Mallampati: I  TM Distance: >3 FB Neck ROM: Full    Dental  (+) Dental Advisory Given, Teeth Intact   Pulmonary neg pulmonary ROS,    Pulmonary exam normal        Cardiovascular negative cardio ROS Normal cardiovascular exam     Neuro/Psych negative neurological ROS  negative psych ROS   GI/Hepatic negative GI ROS, Neg liver ROS,   Endo/Other   Obesity Possible undiagnosed DM   Renal/GU negative Renal ROS     Musculoskeletal negative musculoskeletal ROS (+)   Abdominal   Peds  Hematology negative hematology ROS (+)   Anesthesia Other Findings Covid negative 12/8   Reproductive/Obstetrics                           Anesthesia Physical Anesthesia Plan  ASA: II  Anesthesia Plan: General   Post-op Pain Management:  Regional for Post-op pain   Induction: Intravenous  PONV Risk Score and Plan: 2 and Treatment may vary due to age or medical condition, Ondansetron, Dexamethasone and Midazolam  Airway Management Planned: LMA  Additional Equipment: None  Intra-op Plan:   Post-operative Plan: Extubation in OR  Informed Consent: I have reviewed the patients History and Physical, chart, labs and discussed the procedure including the risks, benefits and alternatives for the proposed anesthesia with the patient or authorized representative who has indicated his/her understanding and acceptance.     Dental advisory given  Plan Discussed with: CRNA and Anesthesiologist  Anesthesia Plan Comments:        Anesthesia Quick Evaluation

## 2019-11-11 NOTE — Discharge Instructions (Signed)
-Maintain postoperative bandages for 3 days.  You may remove them in 3 days and begin showering at that time.  Please do not submerge your incisions underwater.  -Continue with strict elevation of the left lower extremity with "toes above nose."  -For the prevention of blood clots take a 81 mg aspirin once per day for the next 6 weeks.  -Apply ice to the left foot for 30 minutes at a time as able around-the-clock.  -For mild to moderate pain use Tylenol and Advil around-the-clock.  For breakthrough pain use oxycodone as needed.  -You are okay to put partial weight on the left lower extremity through your heel.  Please do not walk flat-footed.  You should use your crutches as well.  When up and weightbearing you should use your postop shoe.  Otherwise I encourage you to move your ankle as much as possible throughout the day to prevent stiffness.  -Please also make an appointment with a primary care provider for evaluation of your consecutive weeks of elevated glucose levels.  This will need to be managed by primary care provider.  Please do this as soon as possible.   Post Anesthesia Home Care Instructions  Activity: Get plenty of rest for the remainder of the day. A responsible individual must stay with you for 24 hours following the procedure.  For the next 24 hours, DO NOT: -Drive a car -Paediatric nurse -Drink alcoholic beverages -Take any medication unless instructed by your physician -Make any legal decisions or sign important papers.  Meals: Start with liquid foods such as gelatin or soup. Progress to regular foods as tolerated. Avoid greasy, spicy, heavy foods. If nausea and/or vomiting occur, drink only clear liquids until the nausea and/or vomiting subsides. Call your physician if vomiting continues.  Special Instructions/Symptoms: Your throat may feel dry or sore from the anesthesia or the breathing tube placed in your throat during surgery. If this causes discomfort, gargle  with warm salt water. The discomfort should disappear within 24 hours.  If you had a scopolamine patch placed behind your ear for the management of post- operative nausea and/or vomiting:  1. The medication in the patch is effective for 72 hours, after which it should be removed.  Wrap patch in a tissue and discard in the trash. Wash hands thoroughly with soap and water. 2. You may remove the patch earlier than 72 hours if you experience unpleasant side effects which may include dry mouth, dizziness or visual disturbances. 3. Avoid touching the patch. Wash your hands with soap and water after contact with the patch.  Regional Anesthesia Blocks  1. Numbness or the inability to move the "blocked" extremity may last from 3-48 hours after placement. The length of time depends on the medication injected and your individual response to the medication. If the numbness is not going away after 48 hours, call your surgeon.  2. The extremity that is blocked will need to be protected until the numbness is gone and the  Strength has returned. Because you cannot feel it, you will need to take extra care to avoid injury. Because it may be weak, you may have difficulty moving it or using it. You may not know what position it is in without looking at it while the block is in effect.  3. For blocks in the legs and feet, returning to weight bearing and walking needs to be done carefully. You will need to wait until the numbness is entirely gone and the strength has returned.  You should be able to move your leg and foot normally before you try and bear weight or walk. You will need someone to be with you when you first try to ensure you do not fall and possibly risk injury.  4. Bruising and tenderness at the needle site are common side effects and will resolve in a few days.  5. Persistent numbness or new problems with movement should be communicated to the surgeon or the [facility] [phone]  [facility] [phone]).
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
## 2020-03-25 ENCOUNTER — Emergency Department (HOSPITAL_BASED_OUTPATIENT_CLINIC_OR_DEPARTMENT_OTHER): Payer: PRIVATE HEALTH INSURANCE

## 2020-03-25 ENCOUNTER — Emergency Department (HOSPITAL_BASED_OUTPATIENT_CLINIC_OR_DEPARTMENT_OTHER)
Admission: EM | Admit: 2020-03-25 | Discharge: 2020-03-25 | Disposition: A | Discharge status: Home / Self Care | Payer: PRIVATE HEALTH INSURANCE | Attending: Emergency Medicine | Admitting: Emergency Medicine

## 2020-03-25 ENCOUNTER — Encounter (HOSPITAL_BASED_OUTPATIENT_CLINIC_OR_DEPARTMENT_OTHER): Payer: Self-pay | Admitting: Emergency Medicine

## 2020-03-25 ENCOUNTER — Other Ambulatory Visit: Payer: Self-pay

## 2020-03-25 DIAGNOSIS — R05 Cough: Secondary | ICD-10-CM | POA: Diagnosis not present

## 2020-03-25 DIAGNOSIS — R0602 Shortness of breath: Secondary | ICD-10-CM | POA: Diagnosis present

## 2020-03-25 DIAGNOSIS — Z79899 Other long term (current) drug therapy: Secondary | ICD-10-CM | POA: Diagnosis not present

## 2020-03-25 DIAGNOSIS — R509 Fever, unspecified: Secondary | ICD-10-CM | POA: Diagnosis not present

## 2020-03-25 DIAGNOSIS — U071 COVID-19: Secondary | ICD-10-CM | POA: Diagnosis not present

## 2020-03-25 DIAGNOSIS — R0781 Pleurodynia: Secondary | ICD-10-CM | POA: Diagnosis not present

## 2020-03-25 LAB — COMPREHENSIVE METABOLIC PANEL
ALT: 40 U/L (ref 0–44)
AST: 46 U/L — ABNORMAL HIGH (ref 15–41)
Albumin: 3.4 g/dL — ABNORMAL LOW (ref 3.5–5.0)
Alkaline Phosphatase: 72 U/L (ref 38–126)
Anion gap: 13 (ref 5–15)
BUN: 12 mg/dL (ref 6–20)
CO2: 26 mmol/L (ref 22–32)
Calcium: 9 mg/dL (ref 8.9–10.3)
Chloride: 93 mmol/L — ABNORMAL LOW (ref 98–111)
Creatinine, Ser: 1.28 mg/dL — ABNORMAL HIGH (ref 0.61–1.24)
GFR calc Af Amer: >60 mL/min (ref >60)
GFR calc non Af Amer: >60 mL/min (ref >60)
Glucose, Bld: 180 mg/dL — ABNORMAL HIGH (ref 70–99)
Potassium: 3.9 mmol/L (ref 3.5–5.1)
Sodium: 132 mmol/L — ABNORMAL LOW (ref 135–145)
Total Bilirubin: 0.6 mg/dL (ref 0.3–1.2)
Total Protein: 8.1 g/dL (ref 6.5–8.1)

## 2020-03-25 LAB — CBC WITH DIFFERENTIAL/PLATELET
Abs Immature Granulocytes: 0.01 10*3/uL (ref 0.00–0.07)
Basophils Absolute: 0 10*3/uL (ref 0.0–0.1)
Basophils Relative: 0 %
Eosinophils Absolute: 0 10*3/uL (ref 0.0–0.5)
Eosinophils Relative: 0 %
HCT: 46.2 % (ref 39.0–52.0)
Hemoglobin: 15.5 g/dL (ref 13.0–17.0)
Immature Granulocytes: 0 %
Lymphocytes Relative: 15 %
Lymphs Abs: 0.9 10*3/uL (ref 0.7–4.0)
MCH: 27 pg (ref 26.0–34.0)
MCHC: 33.5 g/dL (ref 30.0–36.0)
MCV: 80.3 fL (ref 80.0–100.0)
Monocytes Absolute: 0.5 10*3/uL (ref 0.1–1.0)
Monocytes Relative: 9 %
Neutro Abs: 4.3 10*3/uL (ref 1.7–7.7)
Neutrophils Relative %: 76 %
Platelets: 356 10*3/uL (ref 150–400)
RBC: 5.75 MIL/uL (ref 4.22–5.81)
RDW: 13.2 % (ref 11.5–15.5)
WBC: 5.7 10*3/uL (ref 4.0–10.5)
nRBC: 0 % (ref 0.0–0.2)

## 2020-03-25 LAB — SARS CORONAVIRUS 2 AG (30 MIN TAT): SARS Coronavirus 2 Ag: POSITIVE — AB

## 2020-03-25 LAB — D-DIMER, QUANTITATIVE: D-Dimer, Quant: 0.43 ug/mL-FEU (ref 0.00–0.50)

## 2020-03-25 MED ORDER — ACETAMINOPHEN 325 MG PO TABS
ORAL_TABLET | ORAL | Status: AC
  Filled 2020-03-25: qty 2

## 2020-03-25 MED ORDER — BENZONATATE 100 MG PO CAPS: 100.0000 mg | ORAL_CAPSULE | Freq: Three times a day (TID) | ORAL | 0 refills | Status: AC

## 2020-03-25 MED ORDER — ALBUTEROL SULFATE HFA 108 (90 BASE) MCG/ACT IN AERS
4.0000 | INHALATION_SPRAY | Freq: Once | RESPIRATORY_TRACT | Status: AC
  Administered 2020-03-25: 4 via RESPIRATORY_TRACT
  Filled 2020-03-25: qty 6.7

## 2020-03-25 MED ORDER — DOXYCYCLINE HYCLATE 100 MG PO CAPS: 100.0000 mg | ORAL_CAPSULE | Freq: Two times a day (BID) | ORAL | 0 refills | Status: AC

## 2020-03-25 MED ORDER — IBUPROFEN 400 MG PO TABS
600.0000 mg | ORAL_TABLET | Freq: Once | ORAL | Status: AC
  Administered 2020-03-25: 600 mg via ORAL
  Filled 2020-03-25: qty 1

## 2020-03-25 MED ORDER — ACETAMINOPHEN 325 MG PO TABS
650.0000 mg | ORAL_TABLET | Freq: Once | ORAL | Status: AC | PRN
  Administered 2020-03-25: 650 mg via ORAL

## 2020-03-25 NOTE — ED Notes (Signed)
Pt ambulated in room x 3-4 minutes. O2 sats maintained  94-96% on room air

## 2020-03-25 NOTE — ED Triage Notes (Signed)
COVID+, worsening SOB x 3 days.

## 2020-03-25 NOTE — ED Notes (Signed)
Leonia Corona, PA ED Provider at bedside.

## 2020-03-25 NOTE — ED Notes (Signed)
COVID+ results given to RN and PA

## 2020-03-25 NOTE — Discharge Instructions (Addendum)
You were given a prescription for antibiotics. Please take the antibiotic prescription fully. Take two puffs of the albuterol inhaler every 4-5 hours. Take the cough medication as directed.   Continue to quarantine.   Check your pulse oximetry at least twice daily and if your oxygen is less the 92% you need to return to the emergency room immediately. You should follow up with your regular doctor in 2-3 days for reassessment. Return to the emergency room for any new or worsening symptoms.
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
## 2020-03-25 NOTE — ED Notes (Signed)
Provider notified of positive Covid result.

## 2020-03-26 ENCOUNTER — Telehealth: Payer: Self-pay | Admitting: Unknown Physician Specialty
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
